# Patient Record
Sex: Male | Born: 2000 | Race: White | Hispanic: Yes | Marital: Single | Smoking: Never smoker
Health system: Southern US, Community
[De-identification: ages and names within clinical notes are randomized; demographics above are authoritative.]

---
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
## 2020-06-12 ENCOUNTER — Other Ambulatory Visit: Payer: Self-pay

## 2020-06-12 ENCOUNTER — Encounter (HOSPITAL_COMMUNITY): Payer: Self-pay | Admitting: Emergency Medicine

## 2020-06-12 ENCOUNTER — Emergency Department (HOSPITAL_COMMUNITY)
Admission: EM | Admit: 2020-06-12 | Discharge: 2020-06-13 | Disposition: A | Discharge status: Home / Self Care | Payer: Medicaid Other | Attending: Emergency Medicine | Admitting: Emergency Medicine

## 2020-06-12 ENCOUNTER — Emergency Department (HOSPITAL_COMMUNITY): Payer: Medicaid Other

## 2020-06-12 DIAGNOSIS — K625 Hemorrhage of anus and rectum: Secondary | ICD-10-CM | POA: Diagnosis present

## 2020-06-12 DIAGNOSIS — R109 Unspecified abdominal pain: Secondary | ICD-10-CM | POA: Diagnosis not present

## 2020-06-12 LAB — URINALYSIS, ROUTINE W REFLEX MICROSCOPIC
Bilirubin Urine: NEGATIVE
Glucose, UA: NEGATIVE mg/dL
Hgb urine dipstick: NEGATIVE
Ketones, ur: NEGATIVE mg/dL
Nitrite: NEGATIVE
Protein, ur: NEGATIVE mg/dL
Specific Gravity, Urine: 1.042 — ABNORMAL HIGH (ref 1.005–1.030)
pH: 6 (ref 5.0–8.0)

## 2020-06-12 LAB — COMPREHENSIVE METABOLIC PANEL
ALT: 25 U/L (ref 0–44)
AST: 23 U/L (ref 15–41)
Albumin: 3.9 g/dL (ref 3.5–5.0)
Alkaline Phosphatase: 96 U/L (ref 38–126)
Anion gap: 6 (ref 5–15)
BUN: 11 mg/dL (ref 6–20)
CO2: 25 mmol/L (ref 22–32)
Calcium: 8.9 mg/dL (ref 8.9–10.3)
Chloride: 103 mmol/L (ref 98–111)
Creatinine, Ser: 0.77 mg/dL (ref 0.61–1.24)
GFR, Estimated: >60 mL/min (ref >60)
Glucose, Bld: 113 mg/dL — ABNORMAL HIGH (ref 70–99)
Potassium: 4.2 mmol/L (ref 3.5–5.1)
Sodium: 134 mmol/L — ABNORMAL LOW (ref 135–145)
Total Bilirubin: 1 mg/dL (ref 0.3–1.2)
Total Protein: 6.6 g/dL (ref 6.5–8.1)

## 2020-06-12 LAB — CBC
HCT: 45.9 % (ref 39.0–52.0)
Hemoglobin: 15.5 g/dL (ref 13.0–17.0)
MCH: 29.9 pg (ref 26.0–34.0)
MCHC: 33.8 g/dL (ref 30.0–36.0)
MCV: 88.6 fL (ref 80.0–100.0)
Platelets: 227 10*3/uL (ref 150–400)
RBC: 5.18 MIL/uL (ref 4.22–5.81)
RDW: 12.8 % (ref 11.5–15.5)
WBC: 5.7 10*3/uL (ref 4.0–10.5)
nRBC: 0 % (ref 0.0–0.2)

## 2020-06-12 LAB — LIPASE, BLOOD: Lipase: 31 U/L (ref 11–51)

## 2020-06-12 LAB — POC OCCULT BLOOD, ED: Fecal Occult Bld: POSITIVE — AB

## 2020-06-12 MED ORDER — CIPROFLOXACIN HCL 500 MG PO TABS: 500.0000 mg | ORAL_TABLET | Freq: Two times a day (BID) | ORAL | 0 refills | Status: AC

## 2020-06-12 MED ORDER — METRONIDAZOLE 500 MG PO TABS: 500.0000 mg | ORAL_TABLET | Freq: Two times a day (BID) | ORAL | 0 refills | Status: AC

## 2020-06-12 MED ORDER — IOHEXOL 300 MG/ML  SOLN
100.0000 mL | Freq: Once | INTRAMUSCULAR | Status: AC | PRN
  Administered 2020-06-12: 100 mL via INTRAVENOUS

## 2020-06-12 NOTE — ED Notes (Signed)
PA Joldersma notified of pts positive occult

## 2020-06-12 NOTE — Discharge Instructions (Addendum)
Like we discussed, I am going to prescribe you 2 antibiotics.  First antibiotic is called ciprofloxacin.  You need to take this twice a day for the next week.  Do not stop taking this early.  The second antibiotic is called Flagyl.  You need to take this twice a day as well.  Do not stop taking this early.  I have given you follow-up information with gastroenterology.  Please give them a call tomorrow and schedule afollow-up appointment.  If your symptoms worsen, you can return to the emergency department.  It was a pleasure to meet you.

## 2020-06-12 NOTE — ED Provider Notes (Signed)
[facility] EMERGENCY DEPARTMENT Provider Note   CSN: [id number] Arrival date & time: 06/12/20  1725     History Chief Complaint  Patient presents with  . Blood In Stools    Cameron Molina is a 20 y.o. male.  HPI Patient is a 20 year old male with no pertinent medical history who presents the emergency department due to rectal bleeding.  Patient states that about 5 days ago he started experiencing central abdominal pain.  After this began he started having intermittent watery brown diarrhea.  He states that his abdominal pain was also intermittent.  His abdominal pain is since resolved and he continues to have diarrhea for the next few days.  Last night he states he had a well formed brown stool and also noticed blood in the water of the toilet bowl.  He had 3 additional bowel movements today.  The first 2 were brown with blood in the water.  He states the last bowel movement was just blood in the water.  Denies any continued abdominal pain.  No history of similar symptoms.  No recent travel.  He took Pepto-Bismol at the onset of his symptoms but has not had any Pepto-Bismol for about 4 days.  Denies any melena.  No fevers, chills, chest pain, shortness of breath, urinary complaints.    History reviewed. No pertinent past medical history.  There are no problems to display for this patient.   History reviewed. No pertinent surgical history.     No family history on file.  Social History   Tobacco Use  . Smoking status: Never Smoker  . Smokeless tobacco: Never Used  Substance Use Topics  . Alcohol use: Yes  . Drug use: Yes    Types: Marijuana    Home Medications Prior to Admission medications   Medication Sig Start Date End Date Taking? Authorizing Provider  ciprofloxacin (CIPRO) 500 MG tablet Take 1 tablet (500 mg total) by mouth every 12 (twelve) hours for 7 days. 06/12/20 06/19/20 Yes Placido Sou, PA-C  metroNIDAZOLE (FLAGYL) 500 MG tablet  Take 1 tablet (500 mg total) by mouth 2 (two) times daily for 7 days. 06/12/20 06/19/20 Yes Placido Sou, PA-C    Allergies    Patient has no known allergies.  Review of Systems   Review of Systems  All other systems reviewed and are negative. Ten systems reviewed and are negative for acute change, except as noted in the HPI.   Physical Exam Updated Vital Signs BP 135/80 (BP Location: Right Arm)   Pulse 71   Temp 97.9 F (36.6 C)   Resp 16   SpO2 100%   Physical Exam Vitals and nursing note reviewed.  Constitutional:      General: He is not in acute distress.    Appearance: Normal appearance. He is not ill-appearing, toxic-appearing or diaphoretic.  HENT:     Head: Normocephalic and atraumatic.     Right Ear: External ear normal.     Left Ear: External ear normal.     Nose: Nose normal.     Mouth/Throat:     Mouth: Mucous membranes are moist.     Pharynx: Oropharynx is clear. No oropharyngeal exudate or posterior oropharyngeal erythema.  Eyes:     General: No scleral icterus.       Right eye: No discharge.        Left eye: No discharge.     Extraocular Movements: Extraocular movements intact.     Conjunctiva/sclera: Conjunctivae normal.  Cardiovascular:  Rate and Rhythm: Normal rate and regular rhythm.     Pulses: Normal pulses.     Heart sounds: Normal heart sounds. No murmur heard. No friction rub. No gallop.   Pulmonary:     Effort: Pulmonary effort is normal. No respiratory distress.     Breath sounds: Normal breath sounds. No stridor. No wheezing, rhonchi or rales.  Abdominal:     General: Abdomen is flat.     Tenderness: There is no abdominal tenderness.  Genitourinary:    Comments: Nursing chaperone present. Small amount of blood noted around the anus.  No external hemorrhoids noted.  No signs of fissure.  No tenderness noted throughout the exam.  No palpable internal hemorrhoids appreciated.  Very small amount of brown stool noted in the rectal vault  with possible tinge of blood. Musculoskeletal:        General: Normal range of motion.     Cervical back: Normal range of motion and neck supple. No tenderness.  Skin:    General: Skin is warm and dry.  Neurological:     General: No focal deficit present.     Mental Status: He is alert and oriented to person, place, and time.  Psychiatric:        Mood and Affect: Mood normal.        Behavior: Behavior normal.    ED Results / Procedures / Treatments   Labs (all labs ordered are listed, but only abnormal results are displayed) Labs Reviewed  COMPREHENSIVE METABOLIC PANEL - Abnormal; Notable for the following components:      Result Value   Sodium 134 (*)    Glucose, Bld 113 (*)    All other components within normal limits  URINALYSIS, ROUTINE W REFLEX MICROSCOPIC - Abnormal; Notable for the following components:   Specific Gravity, Urine 1.042 (*)    Leukocytes,Ua TRACE (*)    Bacteria, UA FEW (*)    All other components within normal limits  POC OCCULT BLOOD, ED - Abnormal; Notable for the following components:   Fecal Occult Bld POSITIVE (*)    All other components within normal limits  LIPASE, BLOOD  CBC   EKG None  Radiology CT ABDOMEN PELVIS W CONTRAST  Result Date: 06/12/2020 CLINICAL DATA:  Abdominal pain and diarrhea. EXAM: CT ABDOMEN AND PELVIS WITH CONTRAST TECHNIQUE: Multidetector CT imaging of the abdomen and pelvis was performed using the standard protocol following bolus administration of intravenous contrast. CONTRAST:  OMNIPAQUE IOHEXOL 300 MG/ML  SOLN COMPARISON:  Aug 04, 2014 FINDINGS: Lower chest: No acute abnormality. Hepatobiliary: There is mild diffuse fatty infiltration of the liver parenchyma. No focal liver abnormality is seen. No gallstones, gallbladder wall thickening, or biliary dilatation. Pancreas: Unremarkable. No pancreatic ductal dilatation or surrounding inflammatory changes. Spleen: Normal in size without focal abnormality.  Adrenals/Urinary Tract: Adrenal glands are unremarkable. Kidneys are normal, without renal calculi, focal lesion, or hydronephrosis. Bladder is unremarkable. Stomach/Bowel: Stomach is within normal limits. Appendix appears normal. No evidence of bowel wall thickening, distention, or inflammatory changes. Vascular/Lymphatic: No significant vascular findings are present. Subcentimeter mesenteric lymph nodes are seen within the right lower quadrant. Reproductive: Prostate is unremarkable. Other: No abdominal wall hernia or abnormality. No abdominopelvic ascites. Musculoskeletal: No acute or significant osseous findings. IMPRESSION: Fatty liver. Electronically Signed   By: Aram Candela M.D.   On: 06/12/2020 22:02    Procedures Procedures   Medications Ordered in ED Medications  iohexol (OMNIPAQUE) 300 MG/ML solution 100 mL (100 mLs Intravenous  Contrast Given 06/12/20 2153)    ED Course  I have reviewed the triage vital signs and the nursing notes.  Pertinent labs & imaging results that were available during my care of the patient were reviewed by me and considered in my medical decision making (see chart for details).    MDM Rules/Calculators/A&P                          Pt is a 20 y.o. male who presents to the emergency department due to rectal bleeding.  Labs: CBC without abnormalities. Lipase of 31. CMP with a sodium of 134 and glucose of 113. UA shows trace leukocytes and few bacteria. Fecal occult is positive.  Imaging: CT scan of the abdomen and pelvis shows fatty liver but otherwise no abnormalities.  I, Placido Sou, PA-C, personally reviewed and evaluated these images and lab results as part of my medical decision-making.  Unsure of the cause of patient's rectal bleeding.  He had a small amount of blood mixed with brown stool on his rectal exam but no significant hematochezia.  No visible or palpable hemorrhoids.  Hemoglobin and hematocrit within normal limits.  CT of  the abdomen and pelvis is reassuring.  He was having mild abdominal pain when this first started but this has since resolved.  Possible infectious source, though patient has no elevation in white blood cell count or neutrophils.  He is afebrile.  Also consideration for IBD.  Will discharge patient on a course of Cipro as well as Flagyl.  Patient given referral to gastroenterology.  Recommended that he call them as soon as possible to schedule a follow-up appointment.  We discussed return precautions.  He verbalized understanding of the above plan.  His questions were answered and he was amicable at the time of discharge.  Note: Portions of this report may have been transcribed using voice recognition software. Every effort was made to ensure accuracy; however, inadvertent computerized transcription errors may be present.   Final Clinical Impression(s) / ED Diagnoses Final diagnoses:  Rectal bleeding    Rx / DC Orders ED Discharge Orders         Ordered    ciprofloxacin (CIPRO) 500 MG tablet  Every 12 hours        06/12/20 2321    metroNIDAZOLE (FLAGYL) 500 MG tablet  2 times daily        06/12/20 2321           Placido Sou, PA-C 06/13/20 0038    Milagros Loll, MD 06/15/20 [phone]

## 2020-06-12 NOTE — ED Notes (Signed)
Patient verbalized understanding of discharge instructions. Opportunity for questions and answers.  

## 2020-06-12 NOTE — ED Notes (Signed)
Patient transported to CT 

## 2020-06-12 NOTE — ED Notes (Signed)
Pt put back on 4 l [place]

## 2020-06-12 NOTE — ED Triage Notes (Signed)
Reports abd pain and diarrhea from Friday- Sunday.  Yesterday he started having blood in his stool.  Pain and diarrhea have resolved.

## 2020-11-04 ENCOUNTER — Emergency Department (HOSPITAL_COMMUNITY)
Admission: EM | Admit: 2020-11-04 | Discharge: 2020-11-04 | Disposition: A | Discharge status: Home / Self Care | Payer: Medicaid Other | Attending: Emergency Medicine | Admitting: Emergency Medicine

## 2020-11-04 ENCOUNTER — Other Ambulatory Visit: Payer: Self-pay

## 2020-11-04 ENCOUNTER — Encounter (HOSPITAL_COMMUNITY): Payer: Self-pay | Admitting: Emergency Medicine

## 2020-11-04 DIAGNOSIS — R220 Localized swelling, mass and lump, head: Secondary | ICD-10-CM | POA: Insufficient documentation

## 2020-11-04 DIAGNOSIS — Z5321 Procedure and treatment not carried out due to patient leaving prior to being seen by health care provider: Secondary | ICD-10-CM | POA: Insufficient documentation

## 2020-11-04 DIAGNOSIS — K0889 Other specified disorders of teeth and supporting structures: Secondary | ICD-10-CM | POA: Insufficient documentation

## 2020-11-04 NOTE — ED Triage Notes (Addendum)
Pt reports dental pain and jaw swelling x 3 months that worsened this morning. Took tylenol with some relief. Denies fevers and injury to area.

## 2021-03-31 ENCOUNTER — Encounter (HOSPITAL_COMMUNITY): Payer: Self-pay

## 2021-03-31 ENCOUNTER — Emergency Department (HOSPITAL_COMMUNITY): Payer: Medicaid Other

## 2021-03-31 ENCOUNTER — Emergency Department (HOSPITAL_COMMUNITY)
Admission: EM | Admit: 2021-03-31 | Discharge: 2021-03-31 | Disposition: A | Discharge status: Home / Self Care | Payer: Medicaid Other | Attending: Emergency Medicine | Admitting: Emergency Medicine

## 2021-03-31 ENCOUNTER — Other Ambulatory Visit: Payer: Self-pay

## 2021-03-31 DIAGNOSIS — Y92410 Unspecified street and highway as the place of occurrence of the external cause: Secondary | ICD-10-CM | POA: Insufficient documentation

## 2021-03-31 DIAGNOSIS — S0993XA Unspecified injury of face, initial encounter: Secondary | ICD-10-CM | POA: Diagnosis present

## 2021-03-31 DIAGNOSIS — S0990XA Unspecified injury of head, initial encounter: Secondary | ICD-10-CM

## 2021-03-31 MED ORDER — HYDROCODONE-ACETAMINOPHEN 5-325 MG PO TABS
1.0000 | ORAL_TABLET | Freq: Once | ORAL | Status: DC
  Filled 2021-03-31: qty 1

## 2021-03-31 NOTE — ED Notes (Signed)
Pt refuse CT state he have to go to work and is leaving. PT seen leaving out triage
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
## 2022-08-07 ENCOUNTER — Ambulatory Visit
Admission: RE | Admit: 2022-08-07 | Discharge: 2022-08-07 | Disposition: A | Discharge status: Home / Self Care | Payer: Medicaid Other | Source: Ambulatory Visit | Attending: Internal Medicine | Admitting: Internal Medicine

## 2022-08-07 VITALS — BP 131/81 | HR 78 | Temp 98.2°F | Resp 14

## 2022-08-07 DIAGNOSIS — M545 Low back pain, unspecified: Secondary | ICD-10-CM

## 2022-08-07 MED ORDER — PREDNISONE 20 MG PO TABS
40.0000 mg | ORAL_TABLET | Freq: Every day | ORAL | 0 refills | Status: AC
Start: 2022-08-07 — End: 2022-08-12

## 2022-08-07 MED ORDER — CYCLOBENZAPRINE HCL 5 MG PO TABS: 5.0000 mg | ORAL_TABLET | Freq: Two times a day (BID) | ORAL | 0 refills | Status: DC | PRN

## 2022-08-07 NOTE — Discharge Instructions (Signed)
It appears that you have a muscle strain.  I have prescribed you prednisone to decrease inflammation and muscle relaxer.  Please be advised that muscle relaxer can make you drowsy  so do not drive or drink alcohol while taking it. alternate ice and heat to affected area as well.  Follow-up if any symptoms persist or worsen.

## 2022-08-07 NOTE — ED Triage Notes (Addendum)
Pt c/o back pain x 1 mo denies injury to his back

## 2022-08-07 NOTE — ED Provider Notes (Signed)
[facility]    CSN: [id number] Arrival date & time: 08/07/22  1601      History   Chief Complaint Chief Complaint  Patient presents with   Back Pain    Entered by patient    HPI Cameron Molina is a 22 y.o. male.   Patient presents with left lower back pain that has been present for about a month.  He denies any obvious injury to the area.  Pain does not radiate down leg.  Denies numbness or tingling.  Denies any associated urinary symptoms.  Patient has taken 2 doses of a medication that he got over-the-counter that he is not sure the name of.   Back Pain   History reviewed. No pertinent past medical history.  There are no problems to display for this patient.   History reviewed. No pertinent surgical history.     Home Medications    Prior to Admission medications   Medication Sig Start Date End Date Taking? Authorizing Provider  cyclobenzaprine (FLEXERIL) 5 MG tablet Take 1 tablet (5 mg total) by mouth 2 (two) times daily as needed for muscle spasms. 08/07/22  Yes Birttany Dechellis, Rolly Salter E, FNP  predniSONE (DELTASONE) 20 MG tablet Take 2 tablets (40 mg total) by mouth daily for 5 days. 08/07/22 08/12/22 Yes Gustavus Bryant, FNP    Family History History reviewed. No pertinent family history.  Social History Social History   Tobacco Use   Smoking status: Never   Smokeless tobacco: Never  Substance Use Topics   Alcohol use: Yes   Drug use: Yes    Types: Marijuana     Allergies   Patient has no known allergies.   Review of Systems Review of Systems Per HPI  Physical Exam Triage Vital Signs ED Triage Vitals  Enc Vitals Group     BP 08/07/22 1631 131/81     Pulse Rate 08/07/22 1631 78     Resp 08/07/22 1631 14     Temp 08/07/22 1631 98.2 F (36.8 C)     Temp Source 08/07/22 1631 Oral     SpO2 08/07/22 1631 95 %     Weight --      Height --      Head Circumference --      Peak Flow --      Pain Score 08/07/22 1639 6     Pain Loc --       Pain Edu? --      Excl. in GC? --    No data found.  Updated Vital Signs BP 131/81 (BP Location: Right Arm)   Pulse 78   Temp 98.2 F (36.8 C) (Oral)   Resp 14   SpO2 95%   Visual Acuity Right Eye Distance:   Left Eye Distance:   Bilateral Distance:    Right Eye Near:   Left Eye Near:    Bilateral Near:     Physical Exam Constitutional:      General: He is not in acute distress.    Appearance: Normal appearance. He is not toxic-appearing or diaphoretic.  HENT:     Head: Normocephalic and atraumatic.  Eyes:     Extraocular Movements: Extraocular movements intact.     Conjunctiva/sclera: Conjunctivae normal.  Pulmonary:     Effort: Pulmonary effort is normal.  Musculoskeletal:       Back:     Comments: Patient has tenderness to palpation to left lower lumbar region.  No obvious direct spinal tenderness, crepitus, step-off noted.  No swelling or discoloration noted.  Neurological:     General: No focal deficit present.     Mental Status: He is alert and oriented to person, place, and time. Mental status is at baseline.  Psychiatric:        Mood and Affect: Mood normal.        Behavior: Behavior normal.        Thought Content: Thought content normal.        Judgment: Judgment normal.      UC Treatments / Results  Labs (all labs ordered are listed, but only abnormal results are displayed) Labs Reviewed - No data to display  EKG   Radiology No results found.  Procedures Procedures (including critical care time)  Medications Ordered in UC Medications - No data to display  Initial Impression / Assessment and Plan / UC Course  I have reviewed the triage vital signs and the nursing notes.  Pertinent labs & imaging results that were available during my care of the patient were reviewed by me and considered in my medical decision making (see chart for details).     Suspect muscular strain of the lumbar region.  Given no injury or direct spinal  tenderness, imaging was deferred.  Will treat with muscle relaxer and prednisone.  Educated patient that muscle relaxer can make him drowsy and do not drive or drink alcohol with taking it.  Advised supportive care including alternating ice and heat and following up if symptoms persist or worsen.  Patient verbalized understanding and was agreeable with plan. Final Clinical Impressions(s) / UC Diagnoses   Final diagnoses:  Acute left-sided low back pain without sciatica     Discharge Instructions      It appears that you have a muscle strain.  I have prescribed you prednisone to decrease inflammation and muscle relaxer.  Please be advised that muscle relaxer can make you drowsy  so do not drive or drink alcohol while taking it. alternate ice and heat to affected area as well.  Follow-up if any symptoms persist or worsen.    ED Prescriptions     Medication Sig Dispense Auth. Provider   predniSONE (DELTASONE) 20 MG tablet Take 2 tablets (40 mg total) by mouth daily for 5 days. 10 tablet [place], [place] E, [place]   cyclobenzaprine (FLEXERIL) 5 MG tablet Take 1 tablet (5 mg total) by mouth 2 (two) times daily as needed for muscle spasms. 20 tablet [place], Acie Fredrickson, [place]      PDMP not reviewed this encounter.   Gustavus Bryant, [place] 08/07/22 [phone]

## 2022-09-06 ENCOUNTER — Ambulatory Visit
Admission: EM | Admit: 2022-09-06 | Discharge: 2022-09-06 | Disposition: A | Discharge status: Home / Self Care | Payer: Medicaid Other | Attending: Family Medicine | Admitting: Family Medicine

## 2022-09-06 DIAGNOSIS — M545 Low back pain, unspecified: Secondary | ICD-10-CM | POA: Diagnosis not present

## 2022-09-06 MED ORDER — CYCLOBENZAPRINE HCL 5 MG PO TABS: 5.0000 mg | ORAL_TABLET | Freq: Two times a day (BID) | ORAL | 0 refills | Status: AC | PRN

## 2022-09-06 MED ORDER — DEXAMETHASONE SODIUM PHOSPHATE 10 MG/ML IJ SOLN
10.0000 mg | Freq: Once | INTRAMUSCULAR | Status: AC
  Administered 2022-09-06: 10 mg via INTRAMUSCULAR

## 2022-09-06 MED ORDER — INDOMETHACIN 50 MG PO CAPS: 50.0000 mg | ORAL_CAPSULE | Freq: Two times a day (BID) | ORAL | 0 refills | Status: AC | PRN

## 2022-09-06 NOTE — Discharge Instructions (Signed)
You received a steroid injection while you were here in clinic.  This will reduce the inflammation.  Continue cyclobenzaprine 5 mg up to twice daily as needed for acute pain.  Also at the start of any back pain take 1 dose of indomethacin 50 mg with food this should prevent back pain for becoming so severe.  You can also take extra strength Tylenol 500 mg which she can purchase over-the-counter along with the indomethacin this seems to help with recurrent back pain.

## 2022-09-06 NOTE — ED Triage Notes (Signed)
Pt reports he has some lower back pain x 1 month. States the Flexeril that UC prescribed help but he now has pain again.    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 Pt denies injury

## 2022-09-06 NOTE — ED Provider Notes (Signed)
[facility]    CSN: [id number] Arrival date & time: 09/06/22  1029      History   Chief Complaint Chief Complaint  Patient presents with   Back Pain    HPI Cameron Molina is a 22 y.o. male.   HPI Patient seen here previously on 08/07/2022 with a complaint of low back pain.  At that time he was prescribed a muscle relaxer along with prednisone and reports that symptoms did improve. He took all of the medications and reports the pain has gradually worsened over the last two weeks. 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 The pain is localized to mid low back without radiation into buttocks and or legs. He has not taken any medications recently for his back pain. Denies numbness or tingling involving lower extremities or general weakness.      History reviewed. No pertinent surgical history.     Home Medications    Prior to Admission medications   Medication Sig Start Date End Date Taking? Authorizing Provider  indomethacin (INDOCIN) 50 MG capsule Take 1 capsule (50 mg total) by mouth 2 (two) times daily as needed (Take at inital onset of back pain up to twice daily as needed). 09/06/22  Yes Bing Neighbors, NP  cyclobenzaprine (FLEXERIL) 5 MG tablet Take 1 tablet (5 mg total) by mouth 2 (two) times daily as needed for muscle spasms. 09/06/22   Bing Neighbors, NP    Family History History reviewed. No pertinent family history.  Social History Social History   Tobacco Use   Smoking status: Never   Smokeless tobacco: Never  Substance Use Topics   Alcohol use: Yes   Drug use: Yes    Types: Marijuana     Allergies   Patient has no known allergies.   Review of Systems Review of Systems Pertinent negatives listed in HPI   Physical Exam Triage Vital Signs ED Triage Vitals [09/06/22 1228]  Enc Vitals Group     BP 130/82     Pulse Rate 96     Resp  18     Temp (!) 97.2 F (36.2 C)     Temp Source Oral     SpO2 94 %     Weight      Height      Head Circumference      Peak Flow      Pain Score 7     Pain Loc      Pain Edu?      Excl. in GC?    No data found.  Updated Vital Signs BP 130/82 (BP Location: Left Arm)   Pulse 96   Temp (!) 97.2 F (36.2 C) (Oral)   Resp 18   SpO2 94%   Visual Acuity Right Eye Distance:   Left Eye Distance:   Bilateral Distance:    Right Eye Near:   Left Eye Near:    Bilateral Near:     Physical Exam Vitals reviewed.  Constitutional:      Appearance: Normal appearance.  HENT:     Head: Normocephalic and atraumatic.  Eyes:     Extraocular Movements: Extraocular movements intact.     Conjunctiva/sclera: Conjunctivae normal.     Pupils: Pupils are equal, round, and reactive to light.  Cardiovascular:     Rate and Rhythm: Normal rate and regular rhythm.  Pulmonary:     Effort:  Pulmonary effort is normal.     Breath sounds: Normal breath sounds.  Musculoskeletal:     Cervical back: Normal and normal range of motion.     Thoracic back: Normal.     Lumbar back: Tenderness and bony tenderness present. Decreased range of motion. Negative right straight leg raise test and negative left straight leg raise test. No scoliosis.       Back:  Skin:    General: Skin is warm.  Neurological:     General: No focal deficit present.     Mental Status: He is alert.      UC Treatments / Results  Labs (all labs ordered are listed, but only abnormal results are displayed) Labs Reviewed - No data to display  EKG   Radiology No results found.  Procedures Procedures (including critical care time)  Medications Ordered in UC Medications  dexamethasone (DECADRON) injection 10 mg (10 mg Intramuscular Given 09/06/22 1305)    Initial Impression / Assessment and Plan / UC Course  I have reviewed the triage vital signs and the nursing notes.  Pertinent labs & imaging results that were  available during my care of the patient were reviewed by me and considered in my medical decision making (see chart for details).   Recurrent Low Back Pain, Decadron 10 mg IM given here in clinic. Continue home management of symptoms with indomethacin and for acute pain cyclobenzaprine 5 mg twice daily as needed. Continue Tylenol as needed for pain. Follow-up with  orthopedics if symptoms worsen or doesn't improve,. Final Clinical Impressions(s) / UC Diagnoses   Final diagnoses:  Recurrent low back pain     Discharge Instructions      You received a steroid injection while you were here in clinic.  This will reduce the inflammation.  Continue cyclobenzaprine 5 mg up to twice daily as needed for acute pain.  Also at the start of any back pain take 1 dose of indomethacin 50 mg with food this should prevent back pain for becoming so severe.  You can also take extra strength Tylenol 500 mg which she can purchase over-the-counter along with the indomethacin this seems to help with recurrent back pain.      ED Prescriptions     Medication Sig Dispense Auth. Provider   cyclobenzaprine (FLEXERIL) 5 MG tablet Take 1 tablet (5 mg total) by mouth 2 (two) times daily as needed for muscle spasms. 30 tablet Bing Neighbors, NP   indomethacin (INDOCIN) 50 MG capsule Take 1 capsule (50 mg total) by mouth 2 (two) times daily as needed (Take at inital onset of back pain up to twice daily as needed). 30 capsule Bing Neighbors, NP      PDMP not reviewed this encounter.   Bing Neighbors, NP 09/09/22 0021

## 2022-09-09 NOTE — ED Provider Notes (Incomplete)
  [facility]    CSN: [id number] Arrival date & time: 09/06/22  1029      History   Chief Complaint No chief complaint on file.   HPI Cameron Molina is a 22 y.o. male.   HPI Patient seen here previously on 08/07/2022 with a complaint of low back pain.  At that time he was prescribed a muscle relaxer along with prednisone and reports that symptoms did improve. He took all of the medications and reports the pain has gradually worsened over the last two weeks. 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   History reviewed. No pertinent surgical history.     Home Medications    Prior to Admission medications   Medication Sig Start Date End Date Taking? Authorizing Provider  cyclobenzaprine (FLEXERIL) 5 MG tablet Take 1 tablet (5 mg total) by mouth 2 (two) times daily as needed for muscle spasms. 08/07/22   Gustavus Bryant, FNP    Family History History reviewed. No pertinent family history.  Social History Social History   Tobacco Use  . Smoking status: Never  . Smokeless tobacco: Never  Substance Use Topics  . Alcohol use: Yes  . Drug use: Yes    Types: Marijuana     Allergies   Patient has no known allergies.   Review of Systems Review of Systems   Physical Exam Triage Vital Signs ED Triage Vitals [09/06/22 1228]  Enc Vitals Group     BP 130/82     Pulse Rate 96     Resp 18     Temp (!) 97.2 F (36.2 C)     Temp Source Oral     SpO2 94 %     Weight      Height      Head Circumference      Peak Flow      Pain Score 7     Pain Loc      Pain Edu?      Excl. in GC?    No data found.  Updated Vital Signs BP 130/82 (BP Location: Left Arm)   Pulse 96   Temp (!) 97.2 F (36.2 C) (Oral)   Resp 18   SpO2 94%   Visual Acuity Right Eye Distance:   Left Eye Distance:   Bilateral Distance:    Right Eye Near:   Left Eye Near:     Bilateral Near:     Physical Exam   UC Treatments / Results  Labs (all labs ordered are listed, but only abnormal results are displayed) Labs Reviewed - No data to display  EKG   Radiology No results found.  Procedures Procedures (including critical care time)  Medications Ordered in UC Medications - No data to display  Initial Impression / Assessment and Plan / UC Course  I have reviewed the triage vital signs and the nursing notes.  Pertinent labs & imaging results that were available during my care of the patient were reviewed by me and considered in my medical decision making (see chart for details).     *** Final Clinical Impressions(s) / UC Diagnoses   Final diagnoses:  None   Discharge Instructions   None    ED Prescriptions   None    PDMP not reviewed this encounter.

## 2022-10-26 IMAGING — CT CT ABD-PELV W/ CM
2 of 4 series · 16 of 46 positions shown, 18 images · IV contrast (APPLIED)
Comparison: August 04, 2014

CLINICAL DATA: Abdominal pain and diarrhea.

EXAM:
CT ABDOMEN AND PELVIS WITH CONTRAST
TECHNIQUE: Multidetector CT imaging of the abdomen and pelvis was performed
using the standard protocol following bolus administration of
intravenous contrast.
CONTRAST:  100mL OMNIPAQUE IOHEXOL 300 MG/ML  SOLN

[Series 3: abdomen 5.0 · axial · 0.84mm/px · z∈[-689,-219]mm · 13 of 104 slices shown, 15 images]
[im 5/104  soft-tissue]
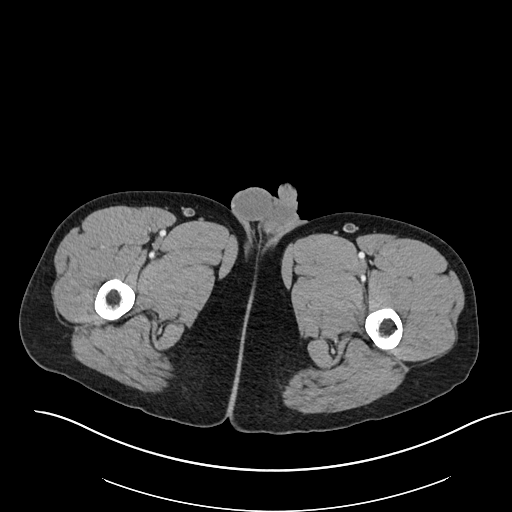
[im 5/104  bone]
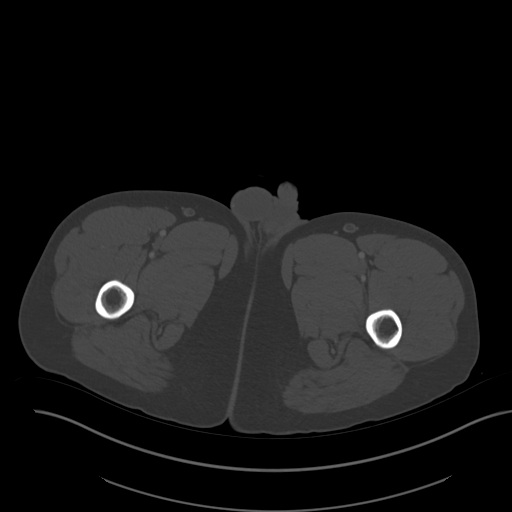
[im 15/104  soft-tissue]
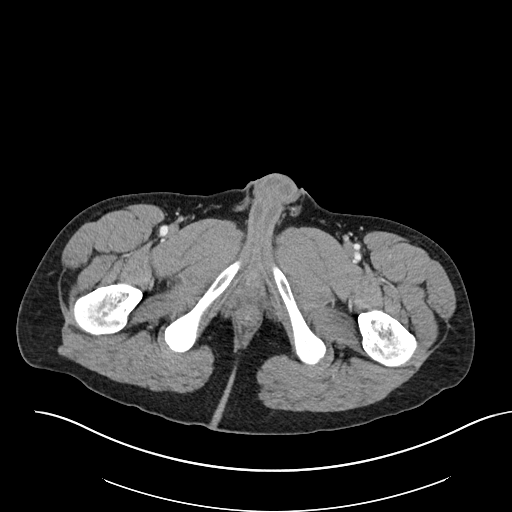
[im 20/104  soft-tissue]
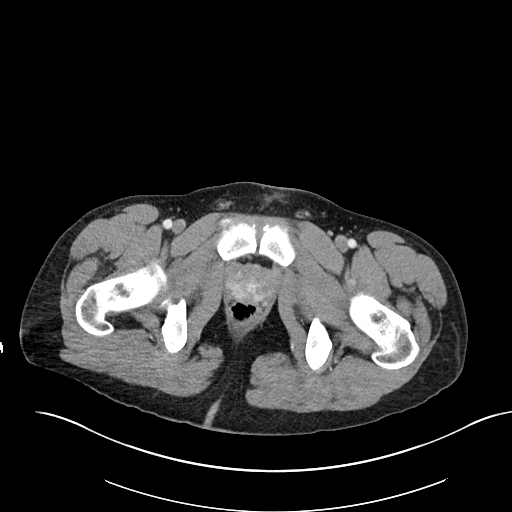
[im 30/104  soft-tissue]
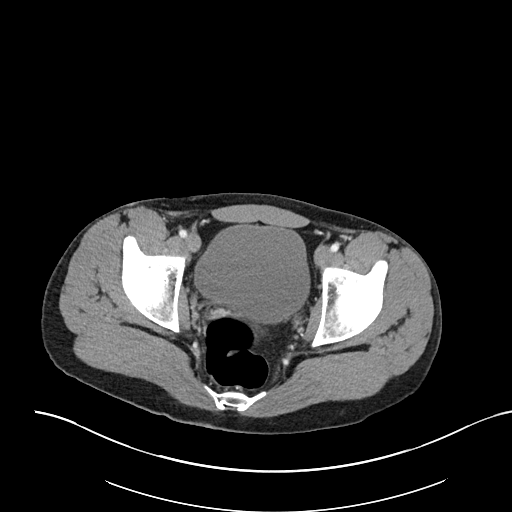
[im 35/104  soft-tissue]
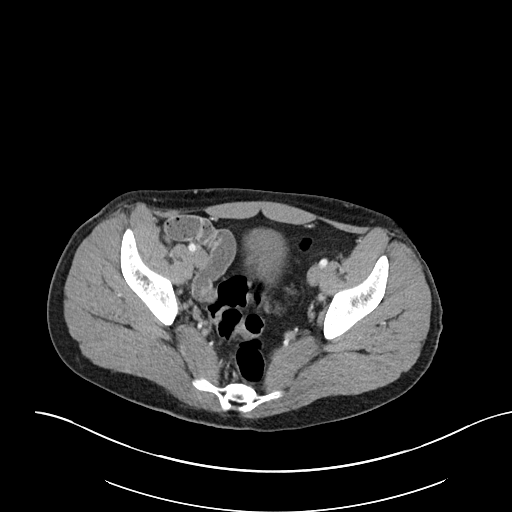
[im 45/104  soft-tissue]
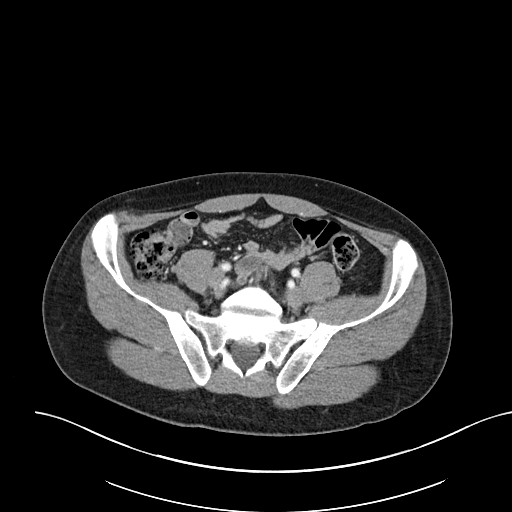
[im 54/104  soft-tissue]
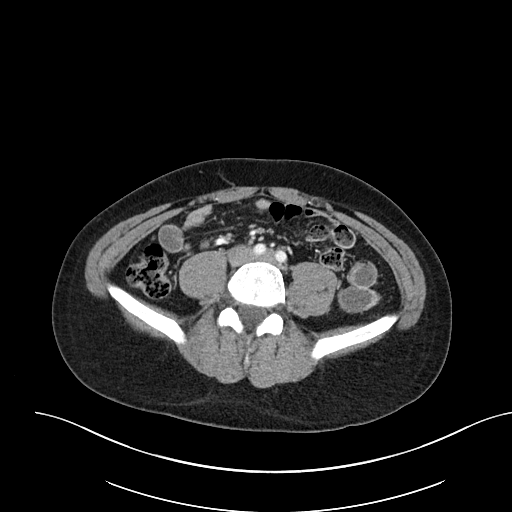
[im 59/104  soft-tissue]
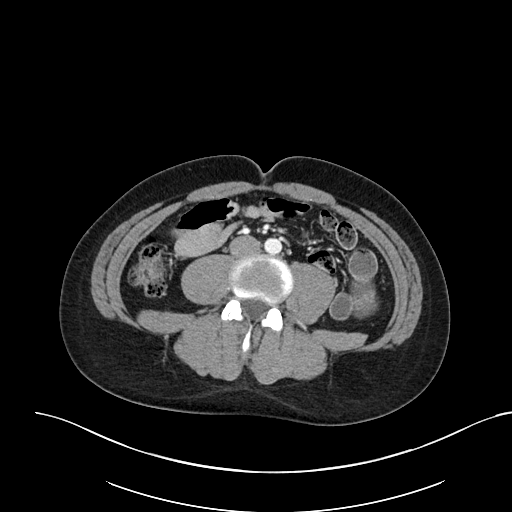
[im 69/104  soft-tissue]
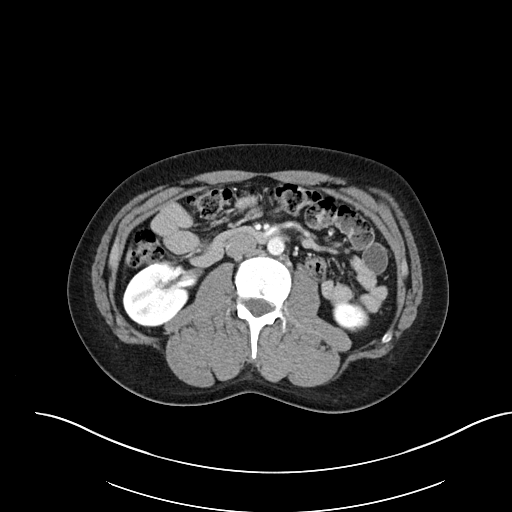
[im 69/104  bone]
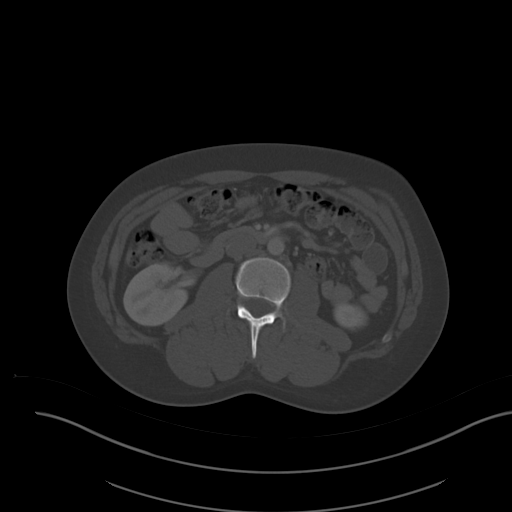
[im 74/104  soft-tissue]
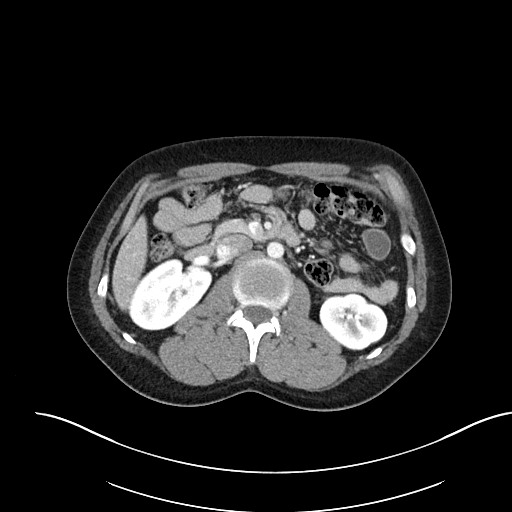
[im 84/104  soft-tissue]
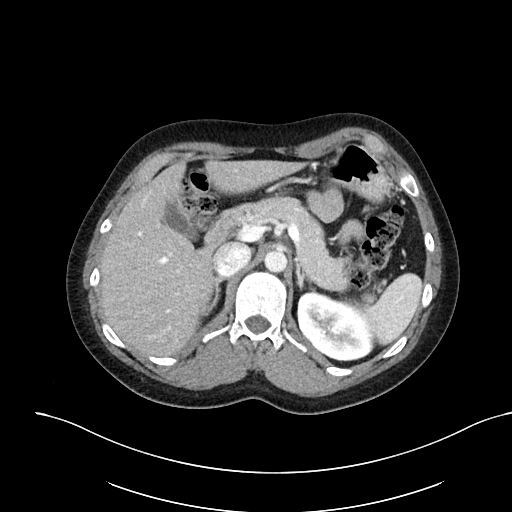
[im 89/104  soft-tissue]
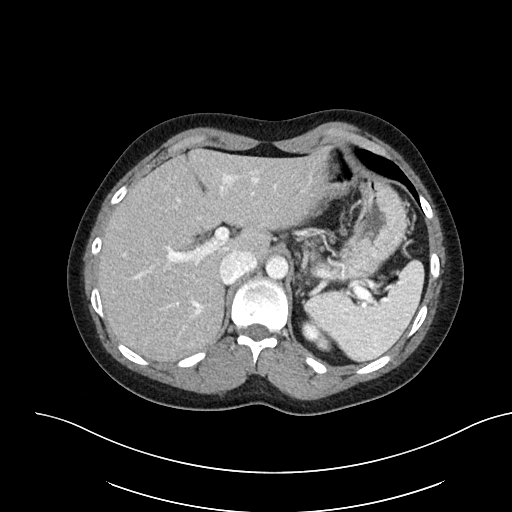
[im 99/104  soft-tissue]
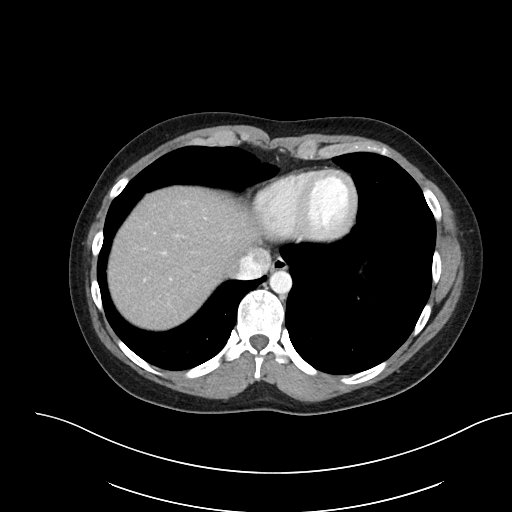

[Series 6: abdomen 3.0 mpr cor · coronal · 0.77mm/px · 3 of 95 slices shown]
[im 32/95  soft-tissue]
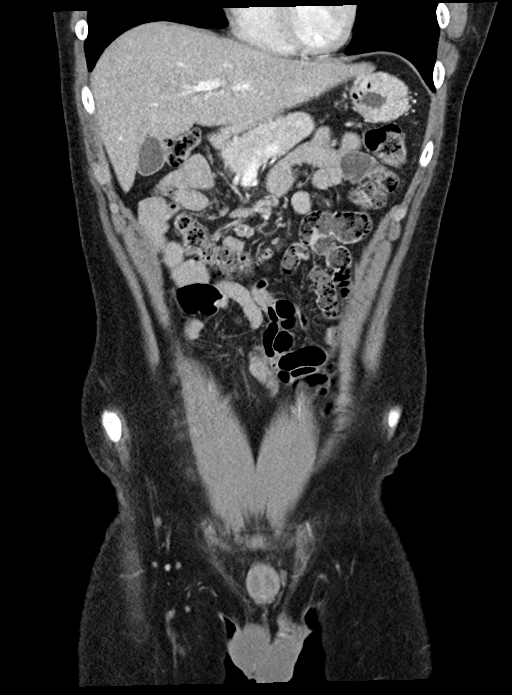
[im 42/95  soft-tissue]
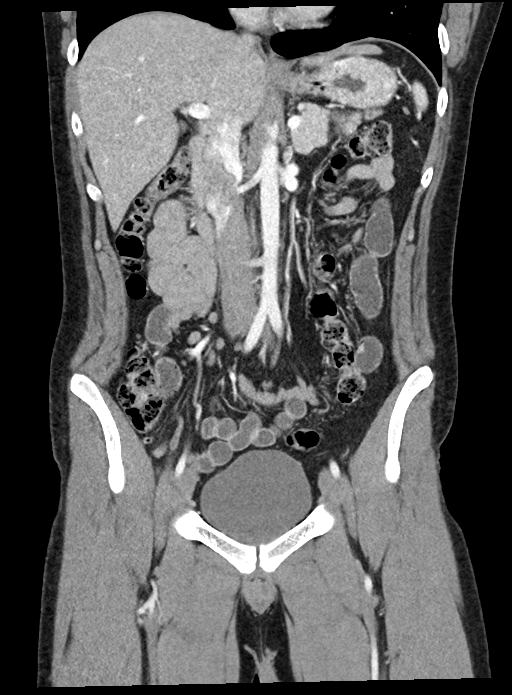
[im 53/95  soft-tissue]
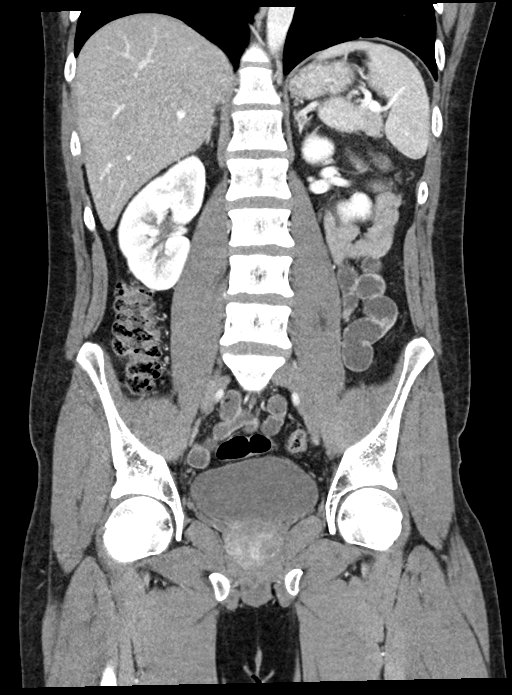

[16 of 46 positions shown; findings below may reference images not displayed]

FINDINGS: Lower chest: No acute abnormality.

Hepatobiliary: There is mild diffuse fatty infiltration of the liver
parenchyma. No focal liver abnormality is seen. No gallstones,
gallbladder wall thickening, or biliary dilatation.

Pancreas: Unremarkable. No pancreatic ductal dilatation or
surrounding inflammatory changes.

Spleen: Normal in size without focal abnormality.

Adrenals/Urinary Tract: Adrenal glands are unremarkable. Kidneys are
normal, without renal calculi, focal lesion, or hydronephrosis.
Bladder is unremarkable.

Stomach/Bowel: Stomach is within normal limits. Appendix appears
normal. No evidence of bowel wall thickening, distention, or
inflammatory changes.

Vascular/Lymphatic: No significant vascular findings are present.
Subcentimeter mesenteric lymph nodes are seen within the right lower
quadrant.

Reproductive: Prostate is unremarkable.

Other: No abdominal wall hernia or abnormality. No abdominopelvic
ascites.

Musculoskeletal: No acute or significant osseous findings.
IMPRESSION: Fatty liver.

## 2023-04-07 ENCOUNTER — Other Ambulatory Visit: Payer: Self-pay

## 2023-04-07 ENCOUNTER — Emergency Department (HOSPITAL_BASED_OUTPATIENT_CLINIC_OR_DEPARTMENT_OTHER)
Admission: EM | Admit: 2023-04-07 | Discharge: 2023-04-07 | Disposition: A | Discharge status: Home / Self Care | Payer: Medicaid Other | Attending: Emergency Medicine | Admitting: Emergency Medicine

## 2023-04-07 ENCOUNTER — Emergency Department (HOSPITAL_BASED_OUTPATIENT_CLINIC_OR_DEPARTMENT_OTHER): Payer: Medicaid Other

## 2023-04-07 ENCOUNTER — Encounter (HOSPITAL_BASED_OUTPATIENT_CLINIC_OR_DEPARTMENT_OTHER): Payer: Self-pay | Admitting: Emergency Medicine

## 2023-04-07 DIAGNOSIS — R112 Nausea with vomiting, unspecified: Secondary | ICD-10-CM | POA: Insufficient documentation

## 2023-04-07 DIAGNOSIS — R519 Headache, unspecified: Secondary | ICD-10-CM | POA: Diagnosis present

## 2023-04-07 MED ORDER — DIPHENHYDRAMINE HCL 50 MG/ML IJ SOLN
25.0000 mg | Freq: Once | INTRAMUSCULAR | Status: AC
  Administered 2023-04-07: 25 mg via INTRAVENOUS
  Filled 2023-04-07: qty 1

## 2023-04-07 MED ORDER — PROCHLORPERAZINE EDISYLATE 10 MG/2ML IJ SOLN
10.0000 mg | Freq: Once | INTRAMUSCULAR | Status: AC
  Administered 2023-04-07: 10 mg via INTRAVENOUS
  Filled 2023-04-07: qty 2

## 2023-04-07 NOTE — ED Notes (Signed)
 Pt alert and oriented X 4 at the time of discharge. RR even and unlabored. No acute distress noted. Pt verbalized understanding of discharge instructions as discussed. Pt ambulatory to lobby at time of discharge.

## 2023-04-07 NOTE — ED Triage Notes (Signed)
 Pt caox4, ambulatory c/o headache and vomiting. Pt states he was drinking alcohol last night and started feeling bad at approx 4am, last drink was around 3a. Pt states he doesn't remember if he had any drugs or not.

## 2023-04-07 NOTE — Discharge Instructions (Signed)
 Follow up with your family doc in the office.  Return for worsening pain, fever, one sided numbness or weakness difficulty with speech or swallowing.

## 2023-04-07 NOTE — ED Provider Notes (Signed)
 [place] EMERGENCY DEPARTMENT AT [facility] Provider Note   CSN: [id number] Arrival date & time: 04/07/23  0734     History  Chief Complaint  Patient presents with   Headache    Cameron Molina is a 23 y.o. male.  23 yo M with a chief complaints of a headache.  This happened suddenly as he was on his way home from drinking.  He then developed some nausea and vomiting.  Denies trauma denies one-sided numbness or weakness denies difficulty speech or swallowing.   Headache      Home Medications Prior to Admission medications   Medication Sig Start Date End Date Taking? Authorizing Provider  cyclobenzaprine  (FLEXERIL ) 5 MG tablet Take 1 tablet (5 mg total) by mouth 2 (two) times daily as needed for muscle spasms. 09/06/22   Arloa Suzen RAMAN, NP  indomethacin  (INDOCIN ) 50 MG capsule Take 1 capsule (50 mg total) by mouth 2 (two) times daily as needed (Take at inital onset of back pain up to twice daily as needed). 09/06/22   Arloa Suzen RAMAN, NP      Allergies    Patient has no known allergies.    Review of Systems   Review of Systems  Neurological:  Positive for headaches.    Physical Exam Updated Vital Signs BP (!) 148/90 (BP Location: Right Arm)   Pulse 76   Temp 97.8 F (36.6 C) (Oral)   Resp 20   Ht 6' 2 (1.88 m)   Wt 81.6 kg   SpO2 98%   BMI 23.11 kg/m  Physical Exam Vitals and nursing note reviewed.  Constitutional:      Appearance: He is well-developed.  HENT:     Head: Normocephalic and atraumatic.  Eyes:     Pupils: Pupils are equal, round, and reactive to light.  Neck:     Vascular: No JVD.  Cardiovascular:     Rate and Rhythm: Normal rate and regular rhythm.     Heart sounds: No murmur heard.    No friction rub. No gallop.  Pulmonary:     Effort: No respiratory distress.     Breath sounds: No wheezing.  Abdominal:     General: There is no distension.     Tenderness: There is no abdominal tenderness. There is no guarding  or rebound.  Musculoskeletal:        General: Normal range of motion.     Cervical back: Normal range of motion and neck supple.  Skin:    Coloration: Skin is not pale.     Findings: No rash.  Neurological:     Mental Status: He is alert and oriented to person, place, and time.     GCS: GCS eye subscore is 4. GCS verbal subscore is 5. GCS motor subscore is 6.     Cranial Nerves: Cranial nerves 2-12 are intact.     Sensory: Sensation is intact.     Motor: Motor function is intact.     Coordination: Coordination is intact.     Comments: Benign neuro exam  Psychiatric:        Behavior: Behavior normal.     ED Results / Procedures / Treatments   Labs (all labs ordered are listed, but only abnormal results are displayed) Labs Reviewed - No data to display  EKG None  Radiology CT Head Wo Contrast Result Date: 04/07/2023 CLINICAL DATA:  Provided history: Headache, sudden, severe. Additional history provided: Headache. Vomiting. EXAM: CT HEAD WITHOUT CONTRAST TECHNIQUE: Contiguous axial  images were obtained from the base of the skull through the vertex without intravenous contrast. RADIATION DOSE REDUCTION: This exam was performed according to the departmental dose-optimization program which includes automated exposure control, adjustment of the mA and/or kV according to patient size and/or use of iterative reconstruction technique. COMPARISON:  Head CT 05/11/2006. FINDINGS: Brain: Cerebral volume is normal. There is no acute intracranial hemorrhage. No demarcated cortical infarct. No extra-axial fluid collection. No evidence of an intracranial mass. No midline shift. Vascular: No hyperdense vessel. Skull: No calvarial fracture or aggressive osseous lesion. Sinuses/Orbits: No mass or acute finding within the imaged orbits. Moderate-to-severe mucosal thickening within the right maxillary sinus at the imaged levels. Mild mucosal thickening within the left maxillary sinus at the imaged levels. Mild  mucosal thickening within bilateral ethmoid air cells (right greater than left), and within the right frontal sinus. IMPRESSION: 1. No evidence of an acute intracranial abnormality. 2. Paranasal sinus disease at the imaged levels as described. Electronically Signed   By: Rockey Childs D.O.   On: 04/07/2023 08:37    Procedures Procedures    Medications Ordered in ED Medications  prochlorperazine  (COMPAZINE ) injection 10 mg (10 mg Intravenous Given 04/07/23 0805)  diphenhydrAMINE  (BENADRYL ) injection 25 mg (25 mg Intravenous Given 04/07/23 0804)    ED Course/ Medical Decision Making/ A&P                                 Medical Decision Making Amount and/or Complexity of Data Reviewed Radiology: ordered.  Risk Prescription drug management.   23 yo M with a chief complaints of sudden onset headache.  Described as thunderclap.  Occurred about 4 hours ago. Will obtain a CT scan.  Treat the headache.  Reassess.  CT of the head without obvious subarachnoid hemorrhage.  Patient feeling better on repeat assessment.  I think it is unlikely that the patient has a venous sinus thrombosis or meningitis.  Will discharge home.  PCP follow-up.  8:48 AM:  I have discussed the diagnosis/risks/treatment options with the patient.  Evaluation and diagnostic testing in the emergency department does not suggest an emergent condition requiring admission or immediate intervention beyond what has been performed at this time.  They will follow up with PCP. We also discussed returning to the ED immediately if new or worsening sx occur. We discussed the sx which are most concerning (e.g., sudden worsening pain, fever, inability to tolerate by mouth, neck pain or stiffness, worsening heading stroke s/sx) that necessitate immediate return. Medications administered to the patient during their visit and any new prescriptions provided to the patient are listed below.  Medications given during this visit Medications   prochlorperazine  (COMPAZINE ) injection 10 mg (10 mg Intravenous Given 04/07/23 0805)  diphenhydrAMINE  (BENADRYL ) injection 25 mg (25 mg Intravenous Given 04/07/23 0804)     The patient appears reasonably screen and/or stabilized for discharge and I doubt any other medical condition or other [facility] requiring further screening, evaluation, or treatment in the ED at this time prior to discharge.          Final Clinical Impression(s) / ED Diagnoses Final diagnoses:  Acute nonintractable headache, unspecified headache type    Rx / DC Orders ED Discharge Orders     None         Emil Share, DO 04/07/23 9151
